# Patient Record
Sex: Male | Born: 1945 | Race: White | Hispanic: No | Marital: Married | Smoking: Never smoker
Health system: Southern US, Community
[De-identification: ages and names within clinical notes are randomized; demographics above are authoritative.]

## PROBLEM LIST (undated history)

## (undated) DIAGNOSIS — K219 Gastro-esophageal reflux disease without esophagitis: Secondary | ICD-10-CM

## (undated) DIAGNOSIS — F32A Depression, unspecified: Secondary | ICD-10-CM

## (undated) DIAGNOSIS — E119 Type 2 diabetes mellitus without complications: Secondary | ICD-10-CM

## (undated) DIAGNOSIS — I10 Essential (primary) hypertension: Secondary | ICD-10-CM

## (undated) DIAGNOSIS — F329 Major depressive disorder, single episode, unspecified: Secondary | ICD-10-CM

## (undated) HISTORY — PX: APPENDECTOMY: SHX54

## (undated) HISTORY — PX: JOINT REPLACEMENT: SHX530

---

## 2013-08-19 ENCOUNTER — Emergency Department (HOSPITAL_BASED_OUTPATIENT_CLINIC_OR_DEPARTMENT_OTHER)
Admission: EM | Admit: 2013-08-19 | Discharge: 2013-08-19 | Disposition: A | Discharge status: Home / Self Care | Payer: Medicare Other | Attending: Emergency Medicine | Admitting: Emergency Medicine

## 2013-08-19 ENCOUNTER — Emergency Department (HOSPITAL_BASED_OUTPATIENT_CLINIC_OR_DEPARTMENT_OTHER): Payer: Medicare Other

## 2013-08-19 ENCOUNTER — Encounter (HOSPITAL_BASED_OUTPATIENT_CLINIC_OR_DEPARTMENT_OTHER): Payer: Self-pay | Admitting: Emergency Medicine

## 2013-08-19 DIAGNOSIS — S0990XA Unspecified injury of head, initial encounter: Secondary | ICD-10-CM | POA: Insufficient documentation

## 2013-08-19 DIAGNOSIS — I10 Essential (primary) hypertension: Secondary | ICD-10-CM | POA: Insufficient documentation

## 2013-08-19 DIAGNOSIS — Y9389 Activity, other specified: Secondary | ICD-10-CM | POA: Insufficient documentation

## 2013-08-19 DIAGNOSIS — Z8719 Personal history of other diseases of the digestive system: Secondary | ICD-10-CM | POA: Insufficient documentation

## 2013-08-19 DIAGNOSIS — S72009A Fracture of unspecified part of neck of unspecified femur, initial encounter for closed fracture: Secondary | ICD-10-CM | POA: Insufficient documentation

## 2013-08-19 DIAGNOSIS — S329XXA Fracture of unspecified parts of lumbosacral spine and pelvis, initial encounter for closed fracture: Secondary | ICD-10-CM

## 2013-08-19 DIAGNOSIS — Z79899 Other long term (current) drug therapy: Secondary | ICD-10-CM | POA: Insufficient documentation

## 2013-08-19 DIAGNOSIS — Y929 Unspecified place or not applicable: Secondary | ICD-10-CM | POA: Insufficient documentation

## 2013-08-19 DIAGNOSIS — W1809XA Striking against other object with subsequent fall, initial encounter: Secondary | ICD-10-CM | POA: Insufficient documentation

## 2013-08-19 DIAGNOSIS — Z8659 Personal history of other mental and behavioral disorders: Secondary | ICD-10-CM | POA: Insufficient documentation

## 2013-08-19 DIAGNOSIS — R55 Syncope and collapse: Secondary | ICD-10-CM | POA: Insufficient documentation

## 2013-08-19 DIAGNOSIS — E119 Type 2 diabetes mellitus without complications: Secondary | ICD-10-CM | POA: Insufficient documentation

## 2013-08-19 HISTORY — DX: Gastro-esophageal reflux disease without esophagitis: K21.9

## 2013-08-19 HISTORY — DX: Depression, unspecified: F32.A

## 2013-08-19 HISTORY — DX: Essential (primary) hypertension: I10

## 2013-08-19 HISTORY — DX: Major depressive disorder, single episode, unspecified: F32.9

## 2013-08-19 HISTORY — DX: Type 2 diabetes mellitus without complications: E11.9

## 2013-08-19 LAB — CBC
HCT: 40.4 % (ref 39.0–52.0)
Hemoglobin: 14 g/dL (ref 13.0–17.0)
MCH: 33.3 pg (ref 26.0–34.0)
MCHC: 34.7 g/dL (ref 30.0–36.0)
MCV: 96.2 fL (ref 78.0–100.0)
PLATELETS: 78 10*3/uL — AB (ref 150–400)
RBC: 4.2 MIL/uL — ABNORMAL LOW (ref 4.22–5.81)
RDW: 12.7 % (ref 11.5–15.5)
WBC: 7 10*3/uL (ref 4.0–10.5)

## 2013-08-19 LAB — BASIC METABOLIC PANEL
BUN: 21 mg/dL (ref 6–23)
CALCIUM: 10.1 mg/dL (ref 8.4–10.5)
CHLORIDE: 99 meq/L (ref 96–112)
CO2: 28 meq/L (ref 19–32)
CREATININE: 1 mg/dL (ref 0.50–1.35)
GFR calc non Af Amer: 76 mL/min — ABNORMAL LOW (ref >90)
GFR, EST AFRICAN AMERICAN: 88 mL/min — AB (ref >90)
Glucose, Bld: 131 mg/dL — ABNORMAL HIGH (ref 70–99)
Potassium: 4.3 mEq/L (ref 3.7–5.3)
SODIUM: 141 meq/L (ref 137–147)

## 2013-08-19 MED ORDER — OXYCODONE-ACETAMINOPHEN 5-325 MG PO TABS: 1.0000 | ORAL_TABLET | Freq: Four times a day (QID) | ORAL | Status: AC | PRN

## 2013-08-19 MED ORDER — MORPHINE SULFATE 4 MG/ML IJ SOLN
6.0000 mg | Freq: Once | INTRAMUSCULAR | Status: AC
  Administered 2013-08-19: 4 mg via INTRAVENOUS
  Filled 2013-08-19: qty 2

## 2013-08-19 MED ORDER — MORPHINE SULFATE 2 MG/ML IJ SOLN
2.0000 mg | Freq: Once | INTRAMUSCULAR | Status: AC
  Administered 2013-08-19: 2 mg via INTRAVENOUS

## 2013-08-19 MED ORDER — ONDANSETRON HCL 4 MG/2ML IJ SOLN
4.0000 mg | Freq: Once | INTRAMUSCULAR | Status: AC
  Administered 2013-08-19: 4 mg via INTRAVENOUS
  Filled 2013-08-19: qty 2

## 2013-08-19 MED ORDER — MORPHINE SULFATE 2 MG/ML IJ SOLN
INTRAMUSCULAR | Status: AC
  Administered 2013-08-19: 2 mg via INTRAVENOUS
  Filled 2013-08-19: qty 1

## 2013-08-19 MED ORDER — DOCUSATE SODIUM 100 MG PO CAPS: 100.0000 mg | ORAL_CAPSULE | Freq: Two times a day (BID) | ORAL | Status: AC

## 2013-08-19 NOTE — ED Notes (Signed)
Pt c/o left hip pain and sts he fell on Friday. Pt sts he had a hip replacement 16 yrs ago. Pt ambulating with a walker today.

## 2013-08-19 NOTE — Discharge Instructions (Signed)
Return to the ED with any concerns including increased pain, pain not controlled by pain medications, leg swelling, or any other alarming symptoms  You should remain nonweightbearing while fractures are healing

## 2013-08-19 NOTE — ED Provider Notes (Signed)
CSN: [id number]     Arrival date & time 08/19/13  4782 History   First MD Initiated Contact with Patient 08/19/13 [phone]     Chief Complaint  Patient presents with  . Hip Pain     (Consider location/radiation/quality/duration/timing/severity/associated sxs/prior Treatment) HPI Pt presents with pain in left hip and groin.  He states he fell 3 days ago.  Was reaching up to get something, felt lightheaded and then fainted.  This caused him to fall to the ground.  No chest pain, no palpitations, no headache associated with fainting.  He states he has had syncope in the past and no specific cause has been identified.  He fell onto the floor onto his left side.  He states that since that time he has had pain in left hip radiating into left groin.  Pain worse with movement and palpation and is constant and aching in nature.  He has been using a walker to walk since the fall and has spent most of the weekend in bed due to pain.  There are no other associated systemic symptoms, there are no other alleviating or modifying factors.   Past Medical History  Diagnosis Date  . Hypertension   . Diabetes mellitus without complication   . Depression   . GERD (gastroesophageal reflux disease)    Past Surgical History  Procedure Laterality Date  . Joint replacement    . Appendectomy     No family history on file. History  Substance Use Topics  . Smoking status: Never Smoker   . Smokeless tobacco: Not on file  . Alcohol Use: Yes    Review of Systems ROS reviewed and all otherwise negative except for mentioned in HPI    Allergies  Review of patient's allergies indicates no known allergies.  Home Medications   Current Outpatient Rx  Name  Route  Sig  Dispense  Refill  . ATENOLOL PO   Oral   Take by mouth.         Marland Kitchen LISINOPRIL PO   Oral   Take by mouth.         . METFORMIN HCL PO   Oral   Take by mouth.         . docusate sodium (COLACE) 100 MG capsule   Oral   Take 1 capsule  (100 mg total) by mouth every 12 (twelve) hours.   60 capsule   0   . oxyCODONE-acetaminophen (PERCOCET/ROXICET) 5-325 MG per tablet   Oral   Take 1-2 tablets by mouth every 6 (six) hours as needed for severe pain.   30 tablet   0    BP 110/61  Pulse 73  Temp(Src) 97.8 F (36.6 C) (Oral)  Resp 16  Ht 5\' 9"  (1.753 m)  Wt 295 lb (133.811 kg)  BMI 43.54 kg/m2  SpO2 95% Vitals reviewed Physical Exam Physical Examination: General appearance - alert, well appearing, and in no distress Mental status - alert, oriented to person, place, and time Eyes - no conjunctival injection, no scleral icterus Mouth - mucous membranes moist, pharynx normal without lesions Chest - clear to auscultation, no wheezes, rales or rhonchi, symmetric air entry Heart - normal rate, regular rhythm, normal S1, S2, no murmurs, rubs, clicks or gallops Abdomen - soft, nontender, nondistended, no masses or organomegaly Neurological - alert, oriented x 3, strength 5/5 in extremities x 4, sensation intact Musculoskeletal - no joint tenderness, deformity or swelling Extremities - peripheral pulses normal, no pedal edema, no clubbing or  cyanosis Skin - normal coloration and turgor, no rashes  ED Course  Procedures (including critical care time)  11:58 AM awaiting call back from patient's orthopedic surgeon, [facility]- paged 45 minutes ago.   12:32 PM d/w Dr. Wyline MoodWeller, partner of Dr. Thamas JaegersLennon.  He agrees with treatment plan for nonweight bearing, pain control and f/u in their office. Pt updated and he is agreeable with this plan as well.  Labs Review Labs Reviewed  CBC - Abnormal; Notable for the following:    RBC 4.20 (*)    Platelets 78 (*)    All other components within normal limits  BASIC METABOLIC PANEL - Abnormal; Notable for the following:    Glucose, Bld 131 (*)    GFR calc non Af Amer 76 (*)    GFR calc Af Amer 88 (*)    All other components within normal limits   Imaging  Review No results found.   EKG Interpretation   Date/Time:  Monday August 19 2013 11:26:09 EDT Ventricular Rate:  65 PR Interval:  150 QRS Duration: 88 QT Interval:  404 QTC Calculation: 420 R Axis:   55 Text Interpretation:  Normal sinus rhythm Normal ECG No old tracing to  compare Confirmed by [facility]  MD, Marquon Alcala [phone]([id number]) on 08/19/2013 3:19:20 PM      MDM   Final diagnoses:  Pelvic fracture  Syncope    Pt presenting with c/o syncope causing a fall and pain in left hip and groin.  Pt has nondisplaced pelvic fractures on xray.  EKG reassuring, pt not anemic.  Pt has had workup of syncope in the past.  D/w orthopedics and plan for nonweightbearing, pt has had improvement in pain after meds in the ED. Discharged with strict return precautions.  Pt agreeable with plan.    Ethelda ChickMartha K Linker, MD 08/23/13 [phone]

## 2017-05-30 ENCOUNTER — Emergency Department (HOSPITAL_BASED_OUTPATIENT_CLINIC_OR_DEPARTMENT_OTHER): Payer: Medicare Other

## 2017-05-30 ENCOUNTER — Encounter (HOSPITAL_BASED_OUTPATIENT_CLINIC_OR_DEPARTMENT_OTHER): Payer: Self-pay | Admitting: *Deleted

## 2017-05-30 ENCOUNTER — Other Ambulatory Visit: Payer: Self-pay

## 2017-05-30 ENCOUNTER — Emergency Department (HOSPITAL_BASED_OUTPATIENT_CLINIC_OR_DEPARTMENT_OTHER)
Admission: EM | Admit: 2017-05-30 | Discharge: 2017-05-30 | Disposition: A | Discharge status: Home / Self Care | Payer: Medicare Other | Attending: Emergency Medicine | Admitting: Emergency Medicine

## 2017-05-30 DIAGNOSIS — E119 Type 2 diabetes mellitus without complications: Secondary | ICD-10-CM | POA: Insufficient documentation

## 2017-05-30 DIAGNOSIS — W19XXXA Unspecified fall, initial encounter: Secondary | ICD-10-CM | POA: Diagnosis not present

## 2017-05-30 DIAGNOSIS — Y929 Unspecified place or not applicable: Secondary | ICD-10-CM | POA: Diagnosis not present

## 2017-05-30 DIAGNOSIS — J449 Chronic obstructive pulmonary disease, unspecified: Secondary | ICD-10-CM | POA: Insufficient documentation

## 2017-05-30 DIAGNOSIS — Y939 Activity, unspecified: Secondary | ICD-10-CM | POA: Diagnosis not present

## 2017-05-30 DIAGNOSIS — S2222XA Fracture of body of sternum, initial encounter for closed fracture: Secondary | ICD-10-CM | POA: Insufficient documentation

## 2017-05-30 DIAGNOSIS — I10 Essential (primary) hypertension: Secondary | ICD-10-CM | POA: Diagnosis not present

## 2017-05-30 DIAGNOSIS — Z79899 Other long term (current) drug therapy: Secondary | ICD-10-CM | POA: Diagnosis not present

## 2017-05-30 DIAGNOSIS — R55 Syncope and collapse: Secondary | ICD-10-CM | POA: Insufficient documentation

## 2017-05-30 DIAGNOSIS — Z7984 Long term (current) use of oral hypoglycemic drugs: Secondary | ICD-10-CM | POA: Insufficient documentation

## 2017-05-30 DIAGNOSIS — Y999 Unspecified external cause status: Secondary | ICD-10-CM | POA: Diagnosis not present

## 2017-05-30 DIAGNOSIS — S2232XA Fracture of one rib, left side, initial encounter for closed fracture: Secondary | ICD-10-CM | POA: Diagnosis not present

## 2017-05-30 DIAGNOSIS — S20309A Unspecified superficial injuries of unspecified front wall of thorax, initial encounter: Secondary | ICD-10-CM | POA: Diagnosis present

## 2017-05-30 DIAGNOSIS — Z7982 Long term (current) use of aspirin: Secondary | ICD-10-CM | POA: Diagnosis not present

## 2017-05-30 MED ORDER — HYDROCODONE-ACETAMINOPHEN 5-325 MG PO TABS: 2.0000 | ORAL_TABLET | ORAL | 0 refills | Status: AC | PRN

## 2017-05-30 MED ORDER — BENZONATATE 100 MG PO CAPS: 200.0000 mg | ORAL_CAPSULE | Freq: Three times a day (TID) | ORAL | 0 refills | Status: AC | PRN

## 2017-05-30 MED ORDER — HYDROCODONE-ACETAMINOPHEN 5-325 MG PO TABS
2.0000 | ORAL_TABLET | Freq: Once | ORAL | Status: AC
  Administered 2017-05-30: 2 via ORAL
  Filled 2017-05-30: qty 2

## 2017-05-30 NOTE — ED Triage Notes (Signed)
He fell last night and in the bathroom. Injury to his upper lip. He hit his chest against an unknown object. Possible he hit the sink. He may have had LOC. Pain in his chest with palpation and certain movements. His MD is aware of his syncopal episodes and told him to sit on the edge of the bed before standing.

## 2017-05-30 NOTE — ED Notes (Signed)
Bruising to his right breast, right shoulder and left upper arm.

## 2017-05-30 NOTE — ED Provider Notes (Signed)
[facility] EMERGENCY DEPARTMENT Provider Note   CSN: [id number] Arrival date & time: 05/30/17  1149     History   Chief Complaint Chief Complaint  Patient presents with  . Chest Injury    HPI Gregory Vargas is a 72 y.o. male.  72 year old male with past medical history including hypertension, type 2 diabetes mellitus, GERD, COPD who presents with fall and chest wall pain.  Last night he got up to go to the bathroom and fell while in the bathroom.  He thinks he may have had a brief loss of consciousness.  He injured his upper lip and hit his anterior chest against something because he was having chest soreness after the fall.  His pain is worse with palpation, certain movements and taking a deep breath in. No shortness of breath, abd pain, back pain, or extremity injury. He has been seen by his PCP for syncope before and has been told it is likely because he stands up too fast.   The history is provided by the patient.    Past Medical History:  Diagnosis Date  . Depression   . Diabetes mellitus without complication (HCC)   . GERD (gastroesophageal reflux disease)   . Hypertension     There are no active problems to display for this patient.   Past Surgical History:  Procedure Laterality Date  . APPENDECTOMY    . JOINT REPLACEMENT         Home Medications    Prior to Admission medications   Medication Sig Start Date End Date Taking? Authorizing Provider  Albuterol Sulfate (PROAIR HFA IN) Inhale into the lungs.   Yes Provider, Historical, Gregory Vargas  Aspirin (ASPIR-81 PO) Take by mouth.   Yes Provider, Historical, Gregory Vargas  ATENOLOL PO Take by mouth.   Yes Provider, Historical, Gregory Vargas  citalopram (CELEXA) 40 MG tablet Take 40 mg by mouth daily.   Yes Provider, Historical, Gregory Vargas  furosemide (LASIX) 20 MG tablet Take 20 mg by mouth.   Yes Provider, Historical, Gregory Vargas  LISINOPRIL PO Take by mouth.   Yes Provider, Historical, Gregory Vargas  LOSARTAN POTASSIUM PO Take by mouth.   Yes  Provider, Historical, Gregory Vargas  METFORMIN HCL PO Take by mouth.   Yes Provider, Historical, Gregory Vargas  OXYBUTYNIN CHLORIDE PO Take by mouth.   Yes Provider, Historical, Gregory Vargas  Pantoprazole Sodium (PROTONIX PO) Take by mouth.   Yes Provider, Historical, Gregory Vargas  rosuvastatin (CRESTOR) 5 MG tablet Take 5 mg by mouth daily.   Yes Provider, Historical, Gregory Vargas  benzonatate (TESSALON) 100 MG capsule Take 2 capsules (200 mg total) by mouth 3 (three) times daily as needed for cough. 05/30/17   Daijanae Rafalski, Gregory Finland, Gregory Vargas  docusate sodium (COLACE) 100 MG capsule Take 1 capsule (100 mg total) by mouth every 12 (twelve) hours. 08/19/13   Mabe, Latanya Maudlin, Gregory Vargas  HYDROcodone-acetaminophen (NORCO/VICODIN) 5-325 MG tablet Take 2 tablets by mouth every 4 (four) hours as needed for severe pain. 05/30/17   Lacy Taglieri, Gregory Finland, Gregory Vargas  oxyCODONE-acetaminophen (PERCOCET/ROXICET) 5-325 MG per tablet Take 1-2 tablets by mouth every 6 (six) hours as needed for severe pain. 08/19/13   Mabe, Latanya Maudlin, Gregory Vargas    Family History No family history on file.  Social History Social History   Tobacco Use  . Smoking status: Never Smoker  . Smokeless tobacco: Never Used  Substance Use Topics  . Alcohol use: Yes  . Drug use: No     Allergies   Patient has no known allergies.  Review of Systems Review of Systems All other systems reviewed and are negative except that which was mentioned in HPI   Physical Exam Updated Vital Signs BP (!) 164/104   Pulse 77   Temp 98.2 F (36.8 C) (Oral)   Resp 16   Ht 5\' 9"  (1.753 m)   Wt (!) 142.9 kg (315 lb)   SpO2 95%   BMI 46.52 kg/m   Physical Exam  Constitutional: He is oriented to person, place, and time. He appears well-developed and well-nourished. No distress.  HENT:  Head: Normocephalic and atraumatic.  Moist mucous membranes Small contusion middle of upper lip  Eyes: Conjunctivae are normal. Pupils are equal, round, and reactive to light.  Neck: Neck supple.  Small ecchymosis below chin    Cardiovascular: Normal rate, regular rhythm and normal heart sounds.  No murmur heard. Pulmonary/Chest: Effort normal and breath sounds normal. He exhibits tenderness (central over sternum to left anterior).  Abdominal: Soft. Bowel sounds are normal. He exhibits no distension. There is no tenderness.  Musculoskeletal: He exhibits no edema or tenderness.  Neurological: He is alert and oriented to person, place, and time. He exhibits normal muscle tone. Coordination normal.  Fluent speech  Skin: Skin is warm and dry.  Ecchymosis L medial upper arm  Psychiatric: He has a normal mood and affect. Judgment normal.  Nursing note and vitals reviewed.    ED Treatments / Results  Labs (all labs ordered are listed, but only abnormal results are displayed) Labs Reviewed - No data to display  EKG  EKG Interpretation  Date/Time:  Tuesday May 30 2017 11:57:48 EST Ventricular Rate:  67 PR Interval:  154 QRS Duration: 92 QT Interval:  422 QTC Calculation: 445 R Axis:   48 Text Interpretation:  Normal sinus rhythm Low voltage QRS Cannot rule out Anterior infarct , age undetermined Abnormal ECG similar to previous Confirmed by Frederick Peers [phone]) on 05/30/2017 12:08:36 PM       Radiology Dg Chest 2 View  Result Date: 05/30/2017 CLINICAL DATA:  Fall.  Sternal pain. EXAM: CHEST  2 VIEW COMPARISON:  Prior reports 05/12/2014 and 10/11/2011. FINDINGS: Mediastinum hilar structures normal. Cardiomegaly with normal pulmonary vascularity. Low lung volumes. No pleural effusion or pneumothorax. Degenerative changes thoracic spine. Left posterolateral fourth rib and left posterior-lateral sixth rib fracture noted. A component of callus formation noted about these fractures suggesting that they are old. Old left rib fractures have been previously reported. Clinical correlation suggested. IMPRESSION: 1. Cardiomegaly. Normal pulmonary vascularity. Low lung volumes with mild basilar atelectasis. 2. No  evidence of sternal fracture. Left posterolateral fourth and sixth rib fracture noted. A component callus formation noted suggesting these fractures are old. Electronically Signed   By: Maisie Fus  Register   On: 05/30/2017 13:05   Ct Head Wo Contrast  Result Date: 05/30/2017 CLINICAL DATA:  72 year old male fainted last night hitting chest on sink and top of lip. Sore neck. Initial encounter. EXAM: CT HEAD WITHOUT CONTRAST CT CERVICAL SPINE WITHOUT CONTRAST TECHNIQUE: Multidetector CT imaging of the head and cervical spine was performed following the standard protocol without intravenous contrast. Multiplanar CT image reconstructions of the cervical spine were also generated. COMPARISON:  No comparison CT. FINDINGS: CT HEAD FINDINGS Brain: No intracranial hemorrhage or CT evidence of large acute infarct. Global atrophy. No intracranial mass lesion noted on this unenhanced exam. Vascular: Vascular calcifications Skull: No skull fracture Sinuses/Orbits: Mild exophthalmos. No acute orbital abnormality. Visualized paranasal sinuses are clear. Other: Mastoid air cells and  middle ear cavities are clear. CT CERVICAL SPINE FINDINGS Alignment: Straightening of the cervical spine. Skull base and vertebrae: Evaluation of the mid to lower cervical spine and upper thoracic spine limited by shoulder artifact. Taking this limitation into account, no cervical spine fracture is noted. Soft tissues and spinal canal: No abnormal prevertebral soft tissue swelling. Disc levels: Cervical spondylotic changes most notable on the left at the C5-6 and C6-7 level where there left-sided mild cord flattening. Upper chest: No worrisome lung mass. Other: Carotid bifurcation calcifications IMPRESSION: No skull fracture or intracranial hemorrhage. Intracranial atrophy. Straightening of the cervical spine. Evaluation of the mid to lower cervical spine and upper thoracic spine limited by shoulder artifact. Taking this limitation into account, no  cervical spine fracture is noted. Cervical spondylotic changes most notable on the left at the C5-6 and C6-7 level where there left-sided mild cord flattening. If there were any clinical suspicion of cord injury than MR can be obtained for further delineation. Carotid bifurcation calcifications. Electronically Signed   By: Lacy Duverney M.D.   On: 05/30/2017 14:03   Ct Chest Wo Contrast  Result Date: 05/30/2017 CLINICAL DATA:  Sternal pain after fainting and falling on sink last night. EXAM: CT CHEST WITHOUT CONTRAST TECHNIQUE: Multidetector CT imaging of the chest was performed following the standard protocol without IV contrast. COMPARISON:  Chest x-ray 05/30/2017 FINDINGS: Cardiovascular: Heart is normal size. Minimal calcified plaque over the low left main, left anterior descending and right coronary arteries. Minimal calcified plaque over the thoracic aorta. Mediastinum/Nodes: No mediastinal or hilar adenopathy. Mild increased density over the anterior mediastinum abutting the posterior border of the sternal body of the sternum likely mild hemorrhage related to patient's sternal fracture. Lungs/Pleura: Lungs are well inflated without consolidation or effusion. There is subtle peripheral interstitial thickening over the mid lungs. No evidence pneumothorax. Airways are normal. Upper Abdomen: No acute findings.  Diverticulosis of the colon. Musculoskeletal: Minimally displaced fracture of the upper third of the body of the sternum minimus acute fracture of the posterolateral left fourth rib. Several old lower lateral left rib fractures. Mild depression of the superior endplate of an upper thoracic vertebral body which may be acute or chronic. Mild compression deformity a vertebral body at the thoracolumbar junction which may be acute or chronic. IMPRESSION: Minimally displaced fracture of the upper third of the body of the sternum. Mild associated hemorrhagic debris along the posterior border of the sternum.  Acute displaced left fourth posterolateral rib fracture. Mild depression of the superior endplate of an upper thoracic vertebral body and mild compression fracture of a vertebral body at the thoracolumbar junction as these findings may be acute or chronic. Mild atherosclerotic coronary artery disease. Aortic Atherosclerosis (ICD10-I70.0). Diverticulosis of the colon. Electronically Signed   By: Elberta Fortis M.D.   On: 05/30/2017 13:55   Ct Cervical Spine Wo Contrast  Result Date: 05/30/2017 CLINICAL DATA:  72 year old male fainted last night hitting chest on sink and top of lip. Sore neck. Initial encounter. EXAM: CT HEAD WITHOUT CONTRAST CT CERVICAL SPINE WITHOUT CONTRAST TECHNIQUE: Multidetector CT imaging of the head and cervical spine was performed following the standard protocol without intravenous contrast. Multiplanar CT image reconstructions of the cervical spine were also generated. COMPARISON:  No comparison CT. FINDINGS: CT HEAD FINDINGS Brain: No intracranial hemorrhage or CT evidence of large acute infarct. Global atrophy. No intracranial mass lesion noted on this unenhanced exam. Vascular: Vascular calcifications Skull: No skull fracture Sinuses/Orbits: Mild exophthalmos. No acute orbital abnormality.  Visualized paranasal sinuses are clear. Other: Mastoid air cells and middle ear cavities are clear. CT CERVICAL SPINE FINDINGS Alignment: Straightening of the cervical spine. Skull base and vertebrae: Evaluation of the mid to lower cervical spine and upper thoracic spine limited by shoulder artifact. Taking this limitation into account, no cervical spine fracture is noted. Soft tissues and spinal canal: No abnormal prevertebral soft tissue swelling. Disc levels: Cervical spondylotic changes most notable on the left at the C5-6 and C6-7 level where there left-sided mild cord flattening. Upper chest: No worrisome lung mass. Other: Carotid bifurcation calcifications IMPRESSION: No skull fracture or  intracranial hemorrhage. Intracranial atrophy. Straightening of the cervical spine. Evaluation of the mid to lower cervical spine and upper thoracic spine limited by shoulder artifact. Taking this limitation into account, no cervical spine fracture is noted. Cervical spondylotic changes most notable on the left at the C5-6 and C6-7 level where there left-sided mild cord flattening. If there were any clinical suspicion of cord injury than MR can be obtained for further delineation. Carotid bifurcation calcifications. Electronically Signed   By: Lacy DuverneySteven  Olson M.D.   On: 05/30/2017 14:03    Procedures Procedures (including critical care time)  Medications Ordered in ED Medications  HYDROcodone-acetaminophen (NORCO/VICODIN) 5-325 MG per tablet 2 tablet (2 tablets Oral Given 05/30/17 1332)     Initial Impression / Assessment and Plan / ED Course  I have reviewed the triage vital signs and the nursing notes.  Pertinent imaging results that were available during my care of the patient were reviewed by me and considered in my medical decision making (see chart for details).     PT w/ chest soreness since fall last night. Comfortable on exam, normal O2 sat, normal WOB. No crepitus. CXR with L 4th and 6th rib fractures, likely old. Given tenderness and risk for complication with rib fractures, recommended CT for better visualization. Also obtained CT head/c-spine given LOC.  CT head and C-spine negative for acute injury.  T chest shows one posterior lateral rib fracture and a minimally displaced fracture of body of sternum.  After receiving Vicodin, the patient was sitting up comfortably in bed with normal O2 saturation. He pulled >1500 on IS and reported his pain was well-controlled.  He has been able to ambulate here without problems and states that he is not having any breathing issues currently.  I discussed that his injury will need follow-up by either PCP or trauma surgery but given his  well-controlled symptoms currently, I feel he is appropriate for outpatient management.  I discussed the importance of good pain control and incentive spirometry.  Extensively reviewed return precautions.  He and wife voiced understanding. Final Clinical Impressions(s) / ED Diagnoses   Final diagnoses:  Closed fracture of body of sternum, initial encounter  Closed fracture of one rib of left side, initial encounter    ED Discharge Orders        Ordered    HYDROcodone-acetaminophen (NORCO/VICODIN) 5-325 MG tablet  Every 4 hours PRN     05/30/17 1440    benzonatate (TESSALON) 100 MG capsule  3 times daily PRN     05/30/17 1441       Makayah Pauli, Gregory Finlandachel Morgan, Gregory Vargas 05/30/17 [phone]

## 2019-04-15 IMAGING — CT CT CHEST W/O CM
2 of 3 series · 15 of 36 positions shown, 18 images · non-contrast
Comparison: Chest x-ray 05/30/2017

CLINICAL DATA: Sternal pain after fainting and falling on sink last
night.

EXAM:
CT CHEST WITHOUT CONTRAST
TECHNIQUE: Multidetector CT imaging of the chest was performed following the
standard protocol without IV contrast.

[Series 2: thorax · axial · 0.86mm/px · z∈[-391,-99]mm · 12 of 172 slices shown, 15 images]
[im 13/172  mediastinal]
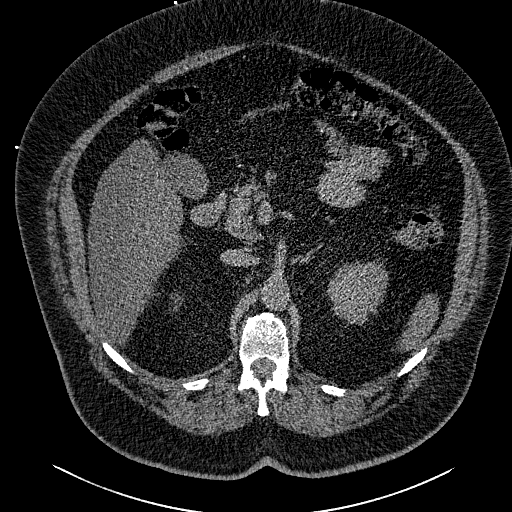
[im 13/172  lung]
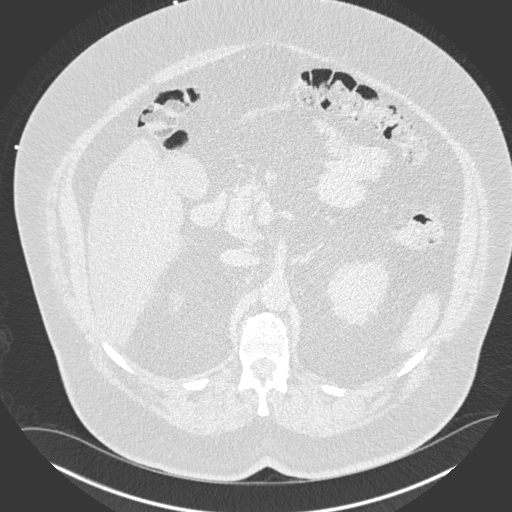
[im 26/172  lung]
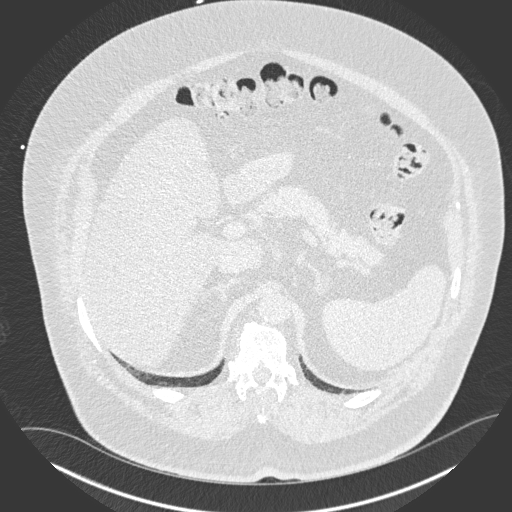
[im 39/172  lung]
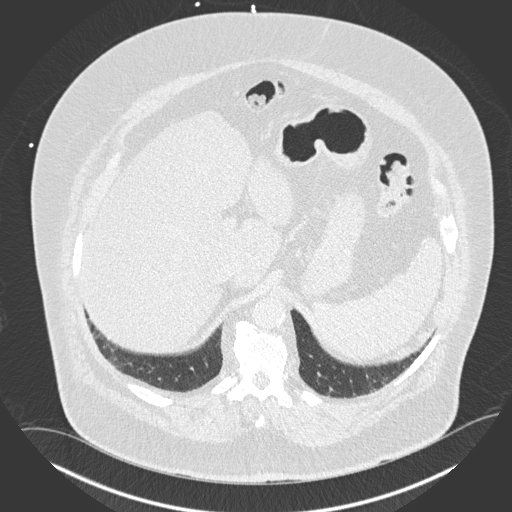
[im 51/172  lung]
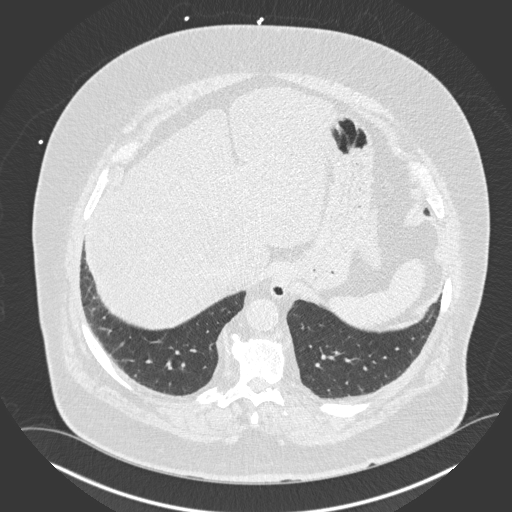
[im 64/172  mediastinal]
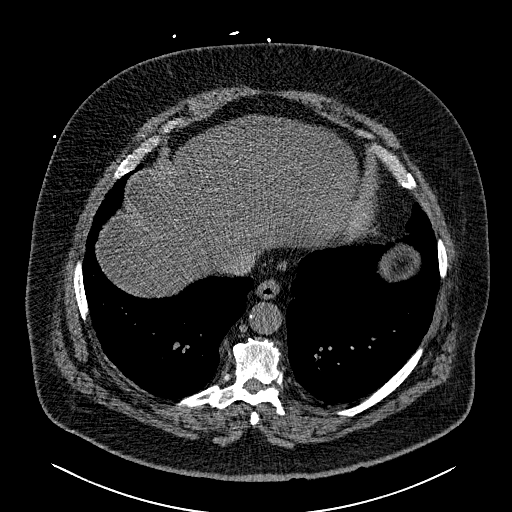
[im 64/172  lung]
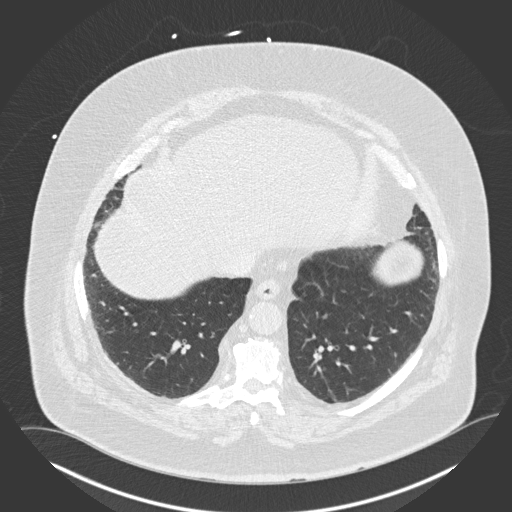
[im 77/172  lung]
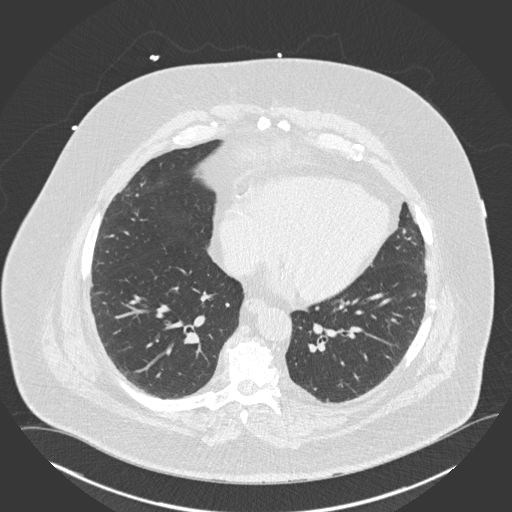
[im 96/172  lung]
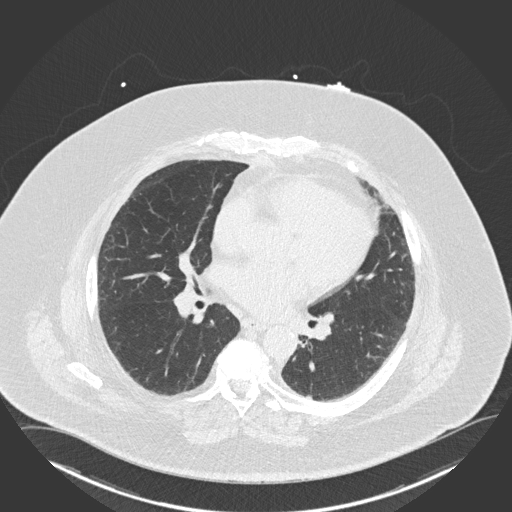
[im 108/172  lung]
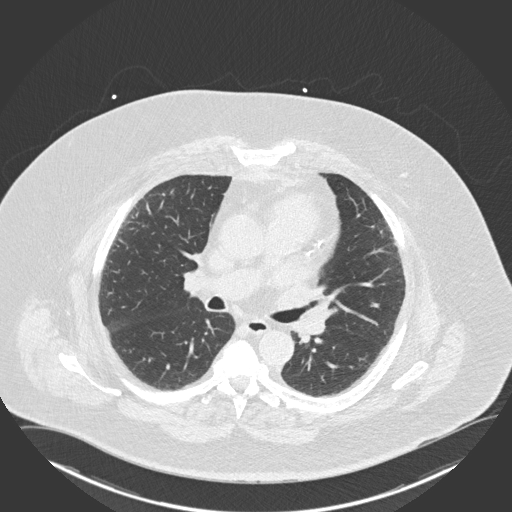
[im 121/172  mediastinal]
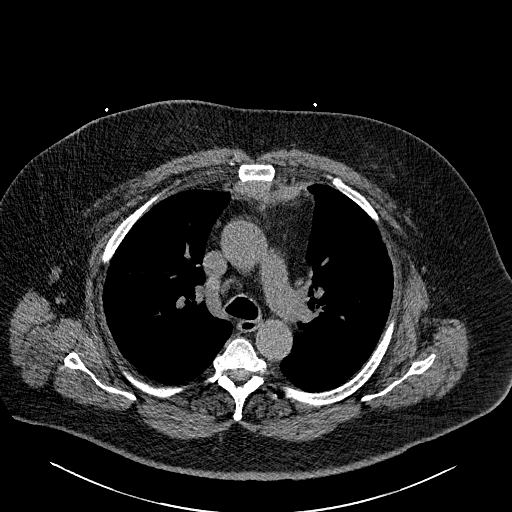
[im 121/172  lung]
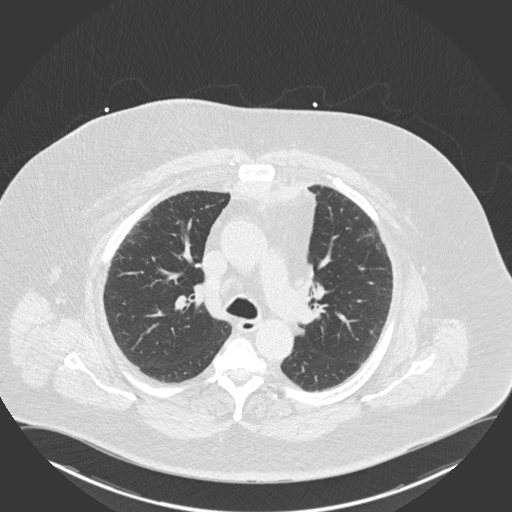
[im 134/172  lung]
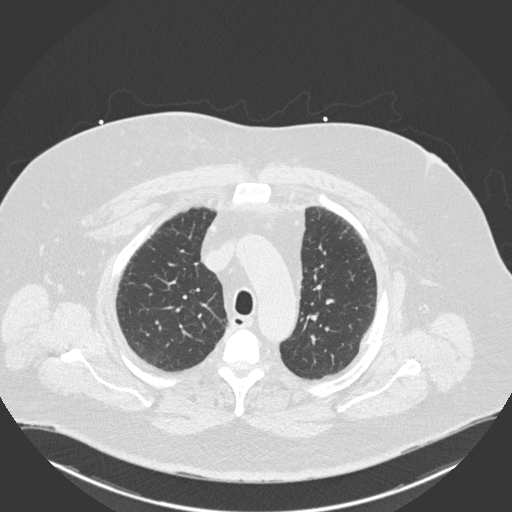
[im 146/172  lung]
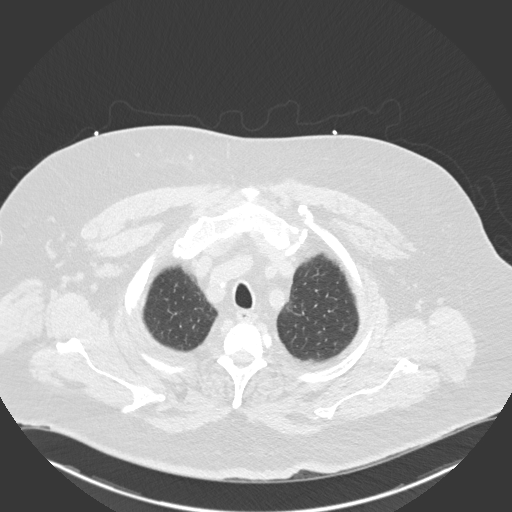
[im 159/172  lung]
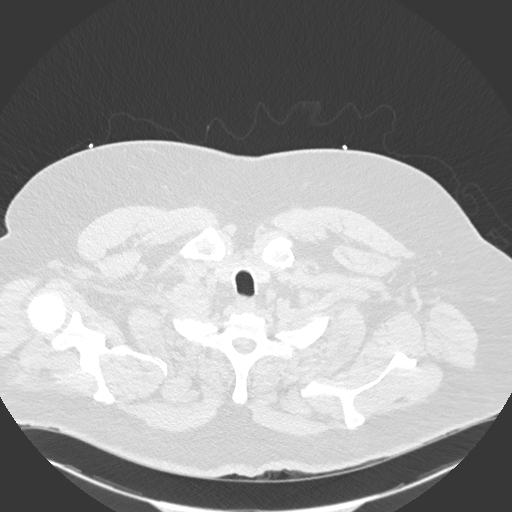

[Series 5: coronal · coronal · 0.69mm/px · 3 of 196 slices shown]
[im 40/196  lung]
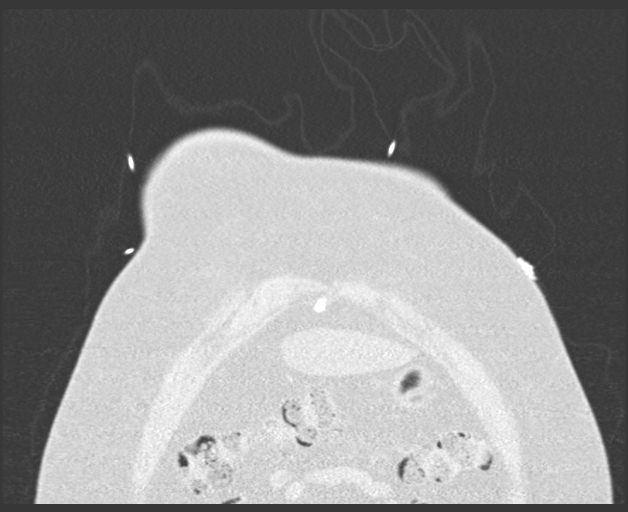
[im 79/196  lung]
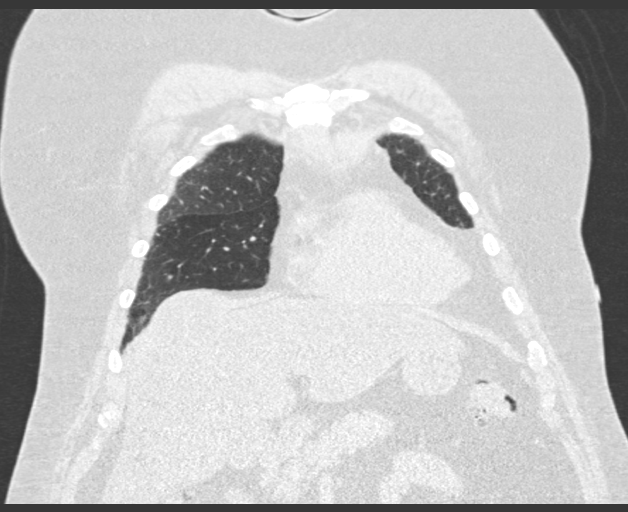
[im 118/196  lung]
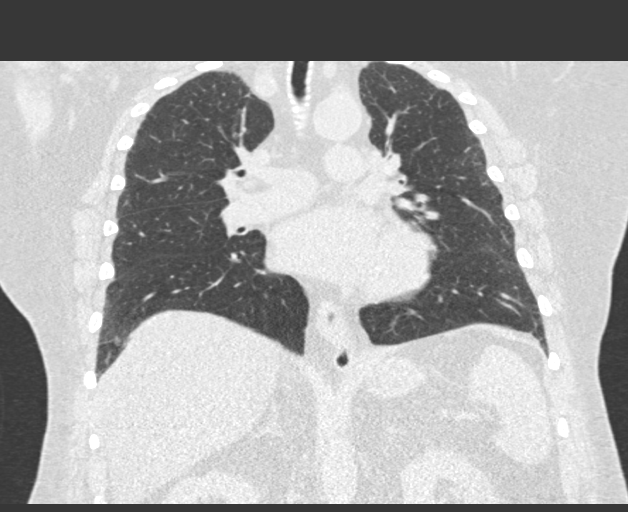

[15 of 36 positions shown; findings below may reference images not displayed]

FINDINGS: Cardiovascular: Heart is normal size. Minimal calcified plaque over
the low left main, left anterior descending and right coronary
arteries. Minimal calcified plaque over the thoracic aorta.

Mediastinum/Nodes: No mediastinal or hilar adenopathy. Mild
increased density over the anterior mediastinum abutting the
posterior border of the sternal body of the sternum likely mild
hemorrhage related to patient's sternal fracture.

Lungs/Pleura: Lungs are well inflated without consolidation or
effusion. There is subtle peripheral interstitial thickening over
the mid lungs. No evidence pneumothorax. Airways are normal.

Upper Abdomen: No acute findings.  Diverticulosis of the colon.

Musculoskeletal: Minimally displaced fracture of the upper third of
the body of the sternum minimus acute fracture of the posterolateral
left fourth rib. Several old lower lateral left rib fractures. Mild
depression of the superior endplate of an upper thoracic vertebral
body which may be acute or chronic. Mild compression deformity a
vertebral body at the thoracolumbar junction which may be acute or
chronic.
IMPRESSION: Minimally displaced fracture of the upper third of the body of the
sternum. Mild associated hemorrhagic debris along the posterior
border of the sternum.

Acute displaced left fourth posterolateral rib fracture.

Mild depression of the superior endplate of an upper thoracic
vertebral body and mild compression fracture of a vertebral body at
the thoracolumbar junction as these findings may be acute or
chronic.

Mild atherosclerotic coronary artery disease.

Aortic Atherosclerosis ([id number]).

Diverticulosis of the colon.

## 2019-04-15 IMAGING — CT CT CERVICAL SPINE W/O CM
4 of 7 series · 15 of 33 positions shown, 17 images · non-contrast
Comparison: No comparison CT.

CLINICAL DATA: 71-year-old male fainted last night hitting chest on
sink and top of lip. Sore neck. Initial encounter.

EXAM:
CT HEAD WITHOUT CONTRAST
CT CERVICAL SPINE WITHOUT CONTRAST
TECHNIQUE: Multidetector CT imaging of the head and cervical spine was
performed following the standard protocol without intravenous
contrast. Multiplanar CT image reconstructions of the cervical spine
were also generated.

[Series 4: head 3.0 mpr cor · coronal · 0.37mm/px · 2 of 77 slices shown]
[im 26/77  bone]
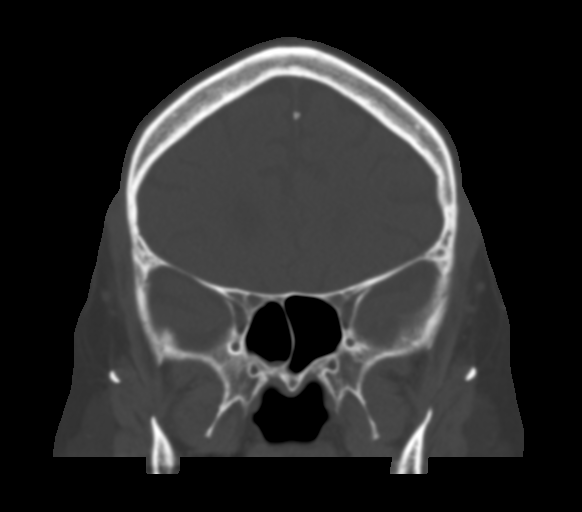
[im 51/77  bone]
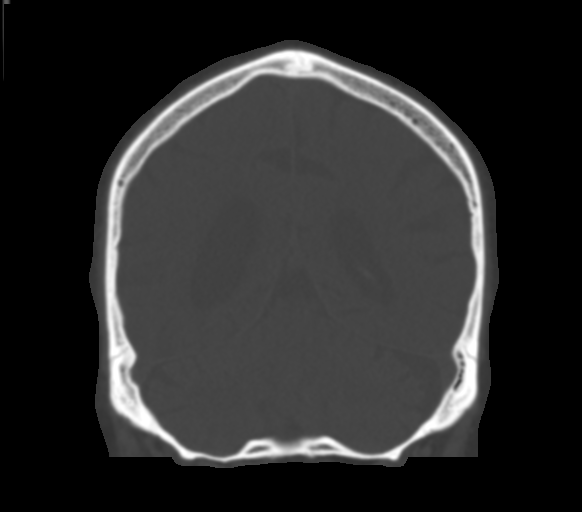

[Series 7: c_spine 2.0 i30s 3 · axial · 0.37mm/px · z∈[-302,-206]mm · 3 of 98 slices shown]
[im 25/98  bone]
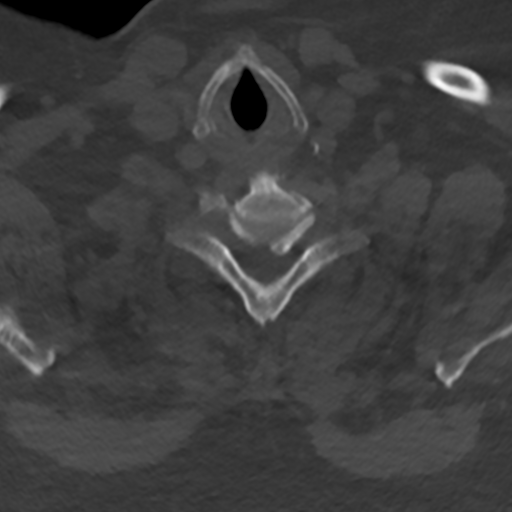
[im 49/98  bone]
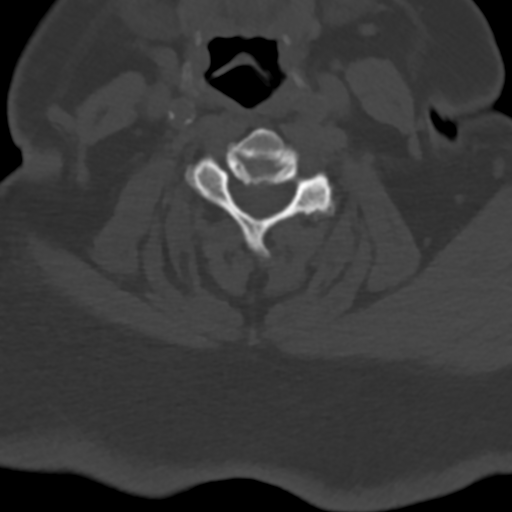
[im 73/98  bone]
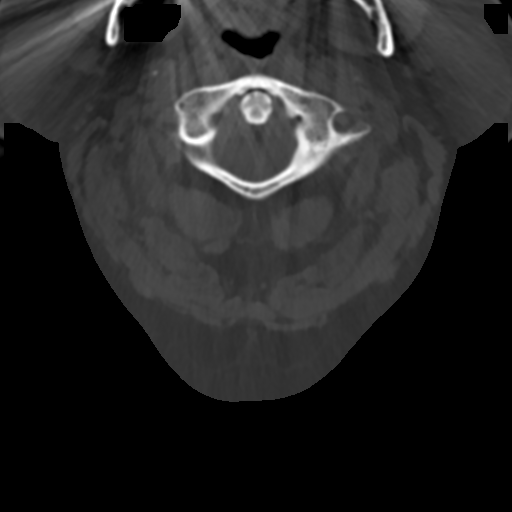

[Series 10: sagittals · sagittal · 0.42mm/px · 5 of 100 slices shown]
[im 15/100  bone]
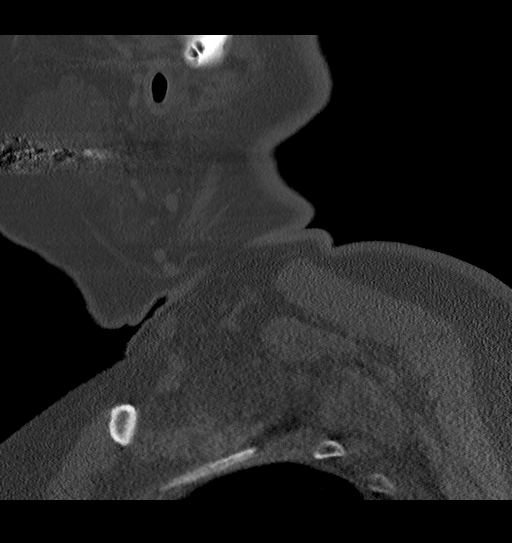
[im 29/100  bone]
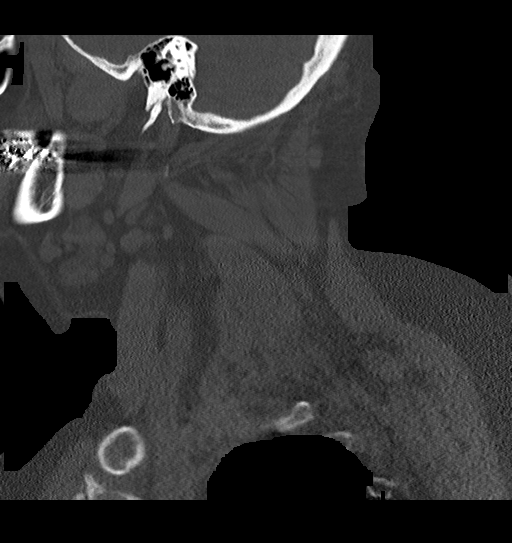
[im 43/100  bone]
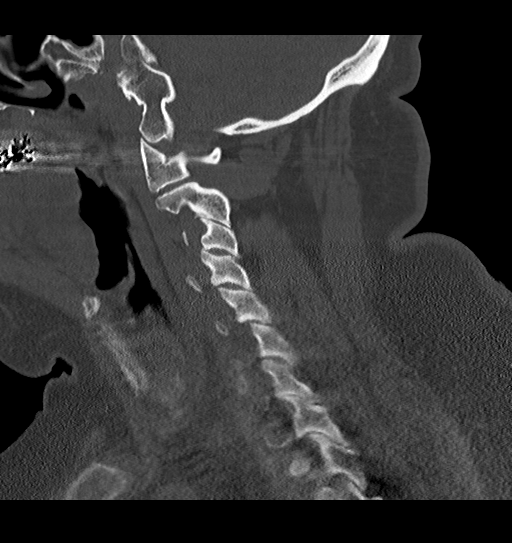
[im 57/100  bone]
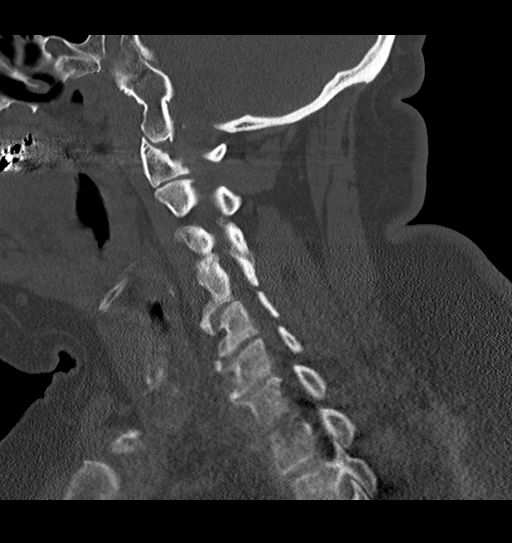
[im 71/100  bone]
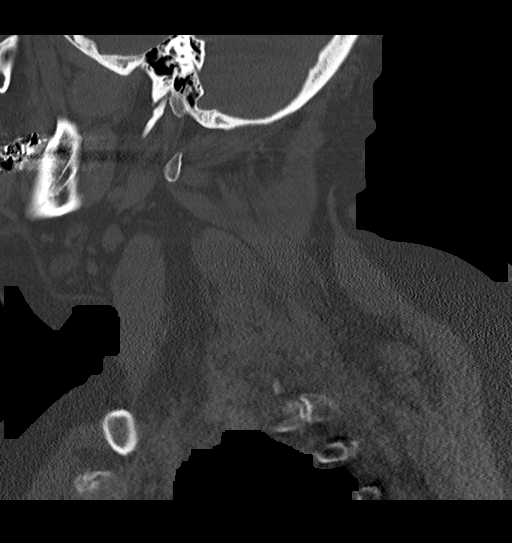

[Series 11: orthogonals · axial · 0.37mm/px · z∈[-373,-214]mm · 5 of 131 slices shown, 7 images]
[im 22/131  soft-tissue]
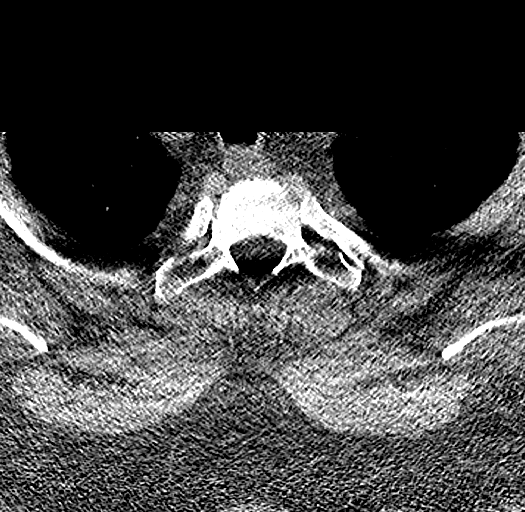
[im 22/131  bone]
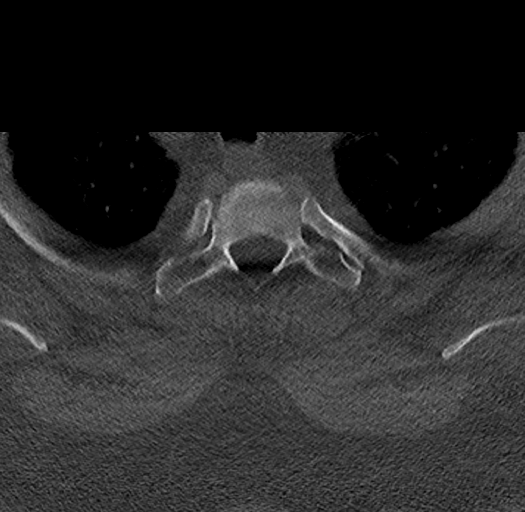
[im 44/131  bone]
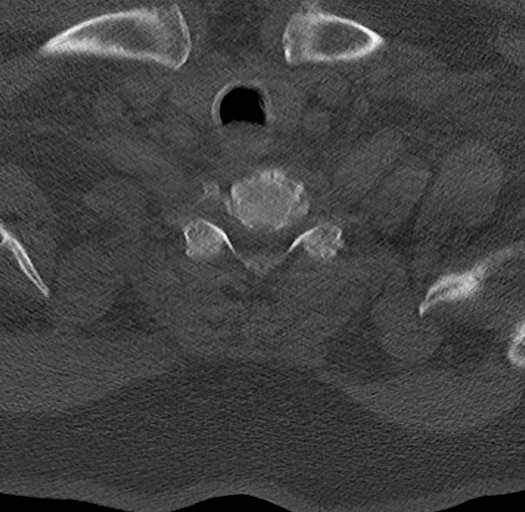
[im 66/131  bone]
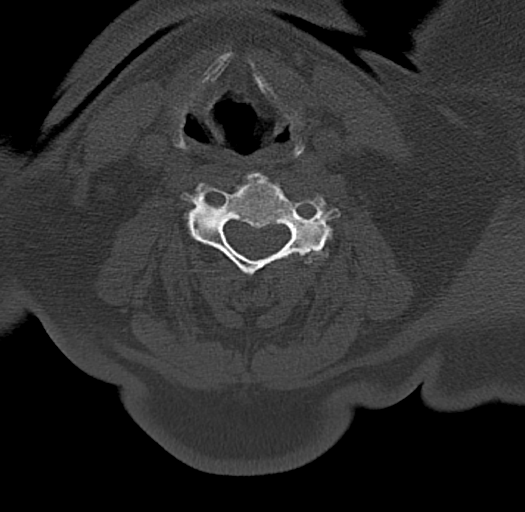
[im 87/131  bone]
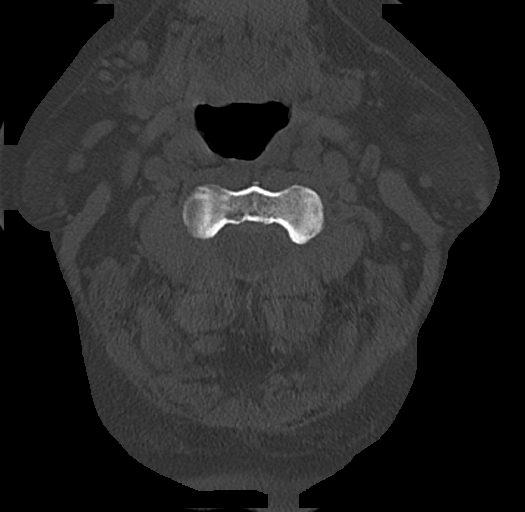
[im 109/131  soft-tissue]
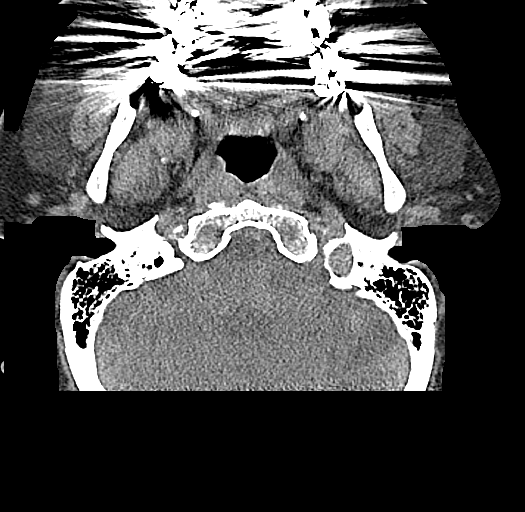
[im 109/131  bone]
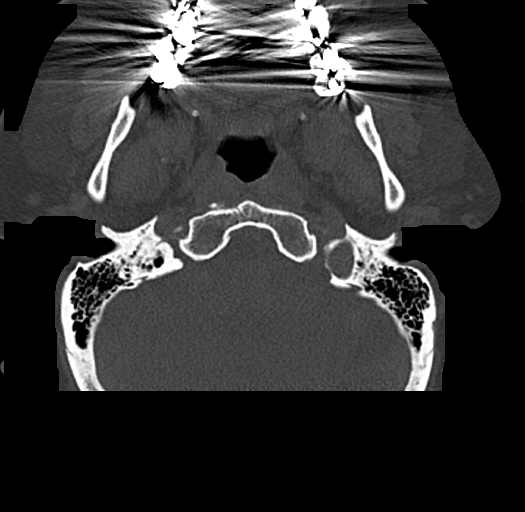

[15 of 33 positions shown; findings below may reference images not displayed]

FINDINGS: CT HEAD FINDINGS

Brain: No intracranial hemorrhage or CT evidence of large acute
infarct.

Global atrophy.

No intracranial mass lesion noted on this unenhanced exam.

Vascular: Vascular calcifications

Skull: No skull fracture

Sinuses/Orbits: Mild exophthalmos. No acute orbital abnormality.
Visualized paranasal sinuses are clear.

Other: Mastoid air cells and middle ear cavities are clear.

CT CERVICAL SPINE FINDINGS

Alignment: Straightening of the cervical spine.

Skull base and vertebrae: Evaluation of the mid to lower cervical
spine and upper thoracic spine limited by shoulder artifact. Taking
this limitation into account, no cervical spine fracture is noted.

Soft tissues and spinal canal: No abnormal prevertebral soft tissue
swelling.

Disc levels: Cervical spondylotic changes most notable on the left
at the C5-6 and C6-7 level where there left-sided mild cord
flattening.

Upper chest: No worrisome lung mass.

Other: Carotid bifurcation calcifications
IMPRESSION: No skull fracture or intracranial hemorrhage.

Intracranial atrophy.

Straightening of the cervical spine.

Evaluation of the mid to lower cervical spine and upper thoracic
spine limited by shoulder artifact. Taking this limitation into
account, no cervical spine fracture is noted.

Cervical spondylotic changes most notable on the left at the C5-6
and C6-7 level where there left-sided mild cord flattening. If there
were any clinical suspicion of cord injury than MR can be obtained
for further delineation.

Carotid bifurcation calcifications.
